# Patient Record
Sex: Female | Born: 2000 | Race: White | Hispanic: No | Marital: Single | Smoking: Never smoker
Health system: Southern US, Community
[De-identification: ages and names within clinical notes are randomized; demographics above are authoritative.]

## PROBLEM LIST (undated history)

## (undated) DIAGNOSIS — Q059 Spina bifida, unspecified: Secondary | ICD-10-CM

## (undated) HISTORY — PX: SPINE SURGERY: SHX786

## (undated) HISTORY — PX: OTHER SURGICAL HISTORY: SHX169

---
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
## 2022-09-09 ENCOUNTER — Encounter: Payer: Self-pay | Admitting: *Deleted

## 2023-02-03 ENCOUNTER — Ambulatory Visit
Admission: EM | Admit: 2023-02-03 | Discharge: 2023-02-03 | Disposition: A | Discharge status: Home / Self Care | Payer: BC Managed Care – PPO | Attending: Family Medicine | Admitting: Family Medicine

## 2023-02-03 ENCOUNTER — Ambulatory Visit (INDEPENDENT_AMBULATORY_CARE_PROVIDER_SITE_OTHER): Payer: BC Managed Care – PPO

## 2023-02-03 DIAGNOSIS — R079 Chest pain, unspecified: Secondary | ICD-10-CM | POA: Diagnosis not present

## 2023-02-03 DIAGNOSIS — R0781 Pleurodynia: Secondary | ICD-10-CM

## 2023-02-03 DIAGNOSIS — M62838 Other muscle spasm: Secondary | ICD-10-CM

## 2023-02-03 MED ORDER — CYCLOBENZAPRINE HCL 5 MG PO TABS
5.0000 mg | ORAL_TABLET | Freq: Three times a day (TID) | ORAL | 0 refills | Status: AC | PRN
Start: 2023-02-03 — End: ?

## 2023-02-03 NOTE — ED Provider Notes (Signed)
[facility]    CSN: [id number] Arrival date & time: 02/03/23  1500      History   Chief Complaint No chief complaint on file.   HPI Kristina Franklin is a 22 y.o. female.   Pt reports she has extremely bad left side upper abdominal pain x 1 day. PT is unable to bend down, laugh, cough etc. without pain increasing. Took gas X and ibuprofen. She sometimes feels a pop but hasn't heard it pop back into place like it did when she had this pain in the past.      Past Medical History:  Diagnosis Date   Spina bifida ([place])     There are no problems to display for this patient.   Past Surgical History:  Procedure Laterality Date   malone     SPINE SURGERY     OB History     Gravida  0   Para  0   Term  0   Preterm  0   AB  0   Living  0      SAB  0   IAB  0   Ectopic  0   Multiple  0   Live Births  0          Home Medications    Prior to Admission medications   Medication Sig Start Date End Date Taking? Authorizing Provider  cyclobenzaprine (FLEXERIL) 5 MG tablet Take 1 tablet (5 mg total) by mouth 3 (three) times daily as needed for muscle spasms. Do not drink alcohol or drive while taking this medication, may cause drowsiness 02/03/23   Volney American, PA-C  methylphenidate 54 MG PO CR tablet Take 54 mg by mouth every morning. 06/27/21   [provider]  oxybutynin (DITROPAN XL) 15 MG 24 hr tablet Take 15 mg by mouth daily. 07/20/21   [provider]  oxybutynin (DITROPAN-XL) 10 MG 24 hr tablet Take 10 mg by mouth at bedtime.    [provider]  sulfamethoxazole-trimethoprim (BACTRIM) 200-40 MG/5ML suspension Take by mouth 2 (two) times daily.    [provider]    Family History History reviewed. No pertinent family history.  Social History Social History   Tobacco Use   Smoking status: Never   Smokeless tobacco: Never  Vaping Use   Vaping Use: Never used  Substance Use Topics   Alcohol  use: Never   Drug use: Never   Allergies   Wound dressing adhesive, Amoxicillin, and Latex   Review of Systems Review of Systems PER HPI  Physical Exam Triage Vital Signs ED Triage Vitals  Enc Vitals Group     BP 02/03/23 1543 125/88     Pulse Rate 02/03/23 1543 (!) 106     Resp --      Temp 02/03/23 1543 99 F (37.2 C)     Temp Source 02/03/23 1543 Oral     SpO2 02/03/23 1543 98 %     Weight --      Height --      Head Circumference --      Peak Flow --      Pain Score 02/03/23 1549 5     Pain Loc --      Pain Edu? --      Excl. in [place]? --    No data found.  Updated Vital Signs BP 125/88 (BP Location: Right Arm)   Pulse (!) 106   Temp 99 F (37.2 C) (Oral)  LMP 06/26/2021   SpO2 98%   Visual Acuity Right Eye Distance:   Left Eye Distance:   Bilateral Distance:    Right Eye Near:   Left Eye Near:    Bilateral Near:     Physical Exam Vitals and nursing note reviewed.  Constitutional:      Appearance: Normal appearance. She is not ill-appearing.  HENT:     Head: Atraumatic.     Mouth/Throat:     Mouth: Mucous membranes are moist.     Pharynx: Oropharynx is clear. No posterior oropharyngeal erythema.  Eyes:     Extraocular Movements: Extraocular movements intact.     Conjunctiva/sclera: Conjunctivae normal.  Cardiovascular:     Rate and Rhythm: Normal rate and regular rhythm.     Heart sounds: Normal heart sounds.  Pulmonary:     Effort: Pulmonary effort is normal.     Breath sounds: Normal breath sounds. No wheezing or rales.  Musculoskeletal:        General: Tenderness present. No swelling, deformity or signs of injury. Normal range of motion.     Cervical back: Normal range of motion and neck supple.     Comments: Minimal left anterior rib region tender to palpation but no bone deformity palpable, no edema or bruising to the area  Skin:    General: Skin is warm and dry.  Neurological:     Mental Status: She is alert and oriented to person,  place, and time.     Motor: No weakness.     Gait: Gait normal.  Psychiatric:        Mood and Affect: Mood normal.        Thought Content: Thought content normal.        Judgment: Judgment normal.      UC Treatments / Results  Labs (all labs ordered are listed, but only abnormal results are displayed) Labs Reviewed - No data to display  EKG   Radiology DG Ribs Unilateral W/Chest Left  Result Date: 02/03/2023 CLINICAL DATA:  Left anterior chest pain EXAM: LEFT RIBS AND CHEST - 3+ VIEW COMPARISON:  None Available. FINDINGS: Cardiac size is within normal limits. Lung fields are clear of any infiltrates or pulmonary edema. There is no pleural effusion or pneumothorax. Ventriculoperitoneal shunt is noted on the right side. No displaced fracture is seen in bony structures. No focal lytic lesions are seen. IMPRESSION: No radiographic abnormality is seen in left ribs. No active cardiopulmonary disease. Right ventriculoperitoneal shunt catheter is noted in place. Electronically Signed   By: Elmer Picker M.D.   On: 02/03/2023 16:30    Procedures Procedures (including critical care time)  Medications Ordered in UC Medications - No data to display  Initial Impression / Assessment and Plan / UC Course  I have reviewed the triage vital signs and the nursing notes.  Pertinent labs & imaging results that were available during my care of the patient were reviewed by me and considered in my medical decision making (see chart for details).     Minimally tachycardic in triage, otherwise vitals reassuring including oxygen saturation of 98% on room air.  She is overall very well-appearing with no significant abnormalities noted on exam.  Chest and left rib x-ray negative for acute abnormalities of both the chest and the ribs.  Suspect possibly a muscular strain of this area or some cartilage inflammation.  Continue anti-inflammatories, heat, rest, gentle stretching, Flexeril as needed.  Return  for worsening symptoms.  Final Clinical Impressions(s) /  UC Diagnoses   Final diagnoses:  Rib pain on left side   Discharge Instructions   None    ED Prescriptions     Medication Sig Dispense Auth. Provider   cyclobenzaprine (FLEXERIL) 5 MG tablet Take 1 tablet (5 mg total) by mouth 3 (three) times daily as needed for muscle spasms. Do not drink alcohol or drive while taking this medication, may cause drowsiness 15 tablet Volney American, [place]      PDMP not reviewed this encounter.   Volney American, [place] 02/03/23 1711

## 2023-02-03 NOTE — ED Triage Notes (Signed)
Pt reports she has extremely bad left side upper abdominal pain x 1 day. PT is unable to bend down, laugh, cough etc. without pain increasing. Took gas X and ibuprofen. She sometimes feels a pop but hasn't heard it pop back into place like it did when she had this pain in the past.
